# Patient Record
Sex: Male | Born: 2000 | Race: White | Hispanic: No | Marital: Single | Smoking: Never smoker
Health system: Southern US, Community
[De-identification: ages and names within clinical notes are randomized; demographics above are authoritative.]

## PROBLEM LIST (undated history)

## (undated) DIAGNOSIS — M419 Scoliosis, unspecified: Secondary | ICD-10-CM

## (undated) DIAGNOSIS — R509 Fever, unspecified: Secondary | ICD-10-CM

## (undated) DIAGNOSIS — Q761 Klippel-Feil syndrome: Secondary | ICD-10-CM

## (undated) DIAGNOSIS — S5290XA Unspecified fracture of unspecified forearm, initial encounter for closed fracture: Secondary | ICD-10-CM

## (undated) DIAGNOSIS — Z00129 Encounter for routine child health examination without abnormal findings: Secondary | ICD-10-CM

## (undated) DIAGNOSIS — A0811 Acute gastroenteropathy due to Norwalk agent: Secondary | ICD-10-CM

## (undated) DIAGNOSIS — L709 Acne, unspecified: Secondary | ICD-10-CM

## (undated) DIAGNOSIS — J302 Other seasonal allergic rhinitis: Secondary | ICD-10-CM

## (undated) HISTORY — DX: Other seasonal allergic rhinitis: J30.2

## (undated) HISTORY — DX: Acute gastroenteropathy due to Norwalk agent: A08.11

## (undated) HISTORY — DX: Fever, unspecified: R50.9

## (undated) HISTORY — DX: Scoliosis, unspecified: M41.9

## (undated) HISTORY — DX: Klippel-Feil syndrome: Q76.1

## (undated) HISTORY — DX: Acne, unspecified: L70.9

## (undated) HISTORY — DX: Encounter for routine child health examination without abnormal findings: Z00.129

## (undated) HISTORY — DX: Unspecified fracture of unspecified forearm, initial encounter for closed fracture: S52.90XA

---
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
## 2022-10-13 ENCOUNTER — Telehealth: Payer: Self-pay | Admitting: Family Medicine

## 2022-10-13 NOTE — Telephone Encounter (Signed)
Pt called stating he was looking to restart his Vyvanse from a previous provider. After consulting with Peterson Ao, advised pt that he would need to have the OV notes from the provider's office sent over to our ffice and he would need an appt to talk with the provider. Advised that Dr. Abner Greenspan is booked out on paper until February and a note would be sent back to look into this for him. Pt acknowledged understanding.

## 2022-10-15 NOTE — Telephone Encounter (Signed)
Pt's mom was returning call. Did not see her on an updated dpr, please advise.

## 2022-10-15 NOTE — Telephone Encounter (Signed)
Left message on machine to call back  

## 2022-10-19 NOTE — Telephone Encounter (Signed)
Called pt Lvm to call our office back to make appt  Per Dr . Abner Greenspan before we can restart medication

## 2022-10-19 NOTE — Progress Notes (Unsigned)
   Established Patient Office Visit  Subjective   Patient ID: Jared Edwards, male    DOB: 2001/06/05  Age: 21 y.o. MRN: [id number]  No chief complaint on file.   HPI    ***   {History (Optional):23778}  ROS    Objective:     There were no vitals taken for this visit. {Vitals History (Optional):23777}  Physical Exam   No results found for any visits on 10/20/22.  {Labs (Optional):23779}  The ASCVD Risk score (Arnett DK, et al., 2019) failed to calculate for the following reasons:   The 2019 ASCVD risk score is only valid for ages 46 to 51    Assessment & Plan:   Problem List Items Addressed This Visit   None   No follow-ups on file.    Clayborne Dana, NP

## 2022-10-20 ENCOUNTER — Telehealth: Payer: Self-pay | Admitting: Family Medicine

## 2022-10-20 ENCOUNTER — Encounter: Payer: Self-pay | Admitting: Family Medicine

## 2022-10-20 ENCOUNTER — Ambulatory Visit: Payer: 59 | Admitting: Family Medicine

## 2022-10-20 VITALS — BP 115/68 | HR 96 | Wt 157.6 lb

## 2022-10-20 DIAGNOSIS — F909 Attention-deficit hyperactivity disorder, unspecified type: Secondary | ICD-10-CM

## 2022-10-20 DIAGNOSIS — F419 Anxiety disorder, unspecified: Secondary | ICD-10-CM

## 2022-10-20 DIAGNOSIS — Z79899 Other long term (current) drug therapy: Secondary | ICD-10-CM

## 2022-10-20 DIAGNOSIS — F32A Depression, unspecified: Secondary | ICD-10-CM | POA: Diagnosis not present

## 2022-10-20 MED ORDER — VYVANSE 40 MG PO CAPS: 40.0000 mg | ORAL_CAPSULE | Freq: Every day | ORAL | 0 refills | Status: DC

## 2022-10-20 NOTE — Assessment & Plan Note (Signed)
Verified dosage and reviewed PDMP.  Requesting records from [facility] today Discussed safety of medication and information sheet added to AVS One month supply given for now until records are returned to PCP

## 2022-10-20 NOTE — Assessment & Plan Note (Signed)
No SI/HI He declined starting and medications today.  Referral placed to behavioral healthy for evaluation, med management, counseling

## 2022-10-20 NOTE — Patient Instructions (Addendum)
One month refill of Vyvanse while we wait for records to come in. Behavioral Health referral for mood concerns. You declined any anxiety/depression meds at this time and confirmed that the Vyvanse did not affect your mood at all.  Getting your baseline urine drug screen today.

## 2022-10-20 NOTE — Telephone Encounter (Signed)
Misty Stanley (mother) called to see about having the generic version of vyvanse sent in for pt rather than brand name. Please advise.

## 2022-10-23 LAB — DRUG MONITORING, PANEL 8 WITH CONFIRMATION, URINE
6 Acetylmorphine: NEGATIVE ng/mL (ref <10)
Alcohol Metabolites: NEGATIVE ng/mL (ref <500)
Amphetamine: 6133 ng/mL — ABNORMAL HIGH (ref <250)
Amphetamines: POSITIVE ng/mL — AB (ref <500)
Benzodiazepines: NEGATIVE ng/mL (ref <100)
Buprenorphine, Urine: NEGATIVE ng/mL (ref <5)
Cocaine Metabolite: NEGATIVE ng/mL (ref <150)
Creatinine: 214.4 mg/dL (ref >=20.0)
MDMA: NEGATIVE ng/mL (ref <500)
Marijuana Metabolite: NEGATIVE ng/mL (ref <20)
Methamphetamine: NEGATIVE ng/mL (ref <250)
Opiates: NEGATIVE ng/mL (ref <100)
Oxidant: NEGATIVE ug/mL (ref <200)
Oxycodone: NEGATIVE ng/mL (ref <100)
pH: 6 (ref 4.5–9.0)

## 2022-10-23 LAB — DM TEMPLATE

## 2022-10-25 MED ORDER — LISDEXAMFETAMINE DIMESYLATE 40 MG PO CAPS: 40.0000 mg | ORAL_CAPSULE | ORAL | 0 refills | Status: DC

## 2022-10-25 NOTE — Telephone Encounter (Signed)
Spoke with pharmacy and they stated they were out of stock of with brand and genetic but they could order brand name but it may be expensive. I called him to see if he was ok with this but he was not.

## 2022-10-25 NOTE — Telephone Encounter (Signed)
Patient states they are out of the generic and would like the brand name Vyvanse to be sent to:  [facility] in [place] since they do have it. Please advise.

## 2022-10-26 ENCOUNTER — Other Ambulatory Visit: Payer: Self-pay | Admitting: Family Medicine

## 2022-10-26 ENCOUNTER — Telehealth: Payer: Self-pay | Admitting: Family Medicine

## 2022-10-26 MED ORDER — LISDEXAMFETAMINE DIMESYLATE 40 MG PO CHEW: 40.0000 mg | CHEWABLE_TABLET | Freq: Every day | ORAL | 0 refills | Status: DC

## 2022-10-26 NOTE — Telephone Encounter (Signed)
[facility] pharmacy calling to advise that the vyvanse and generic brand capsules are not available. Pharmacy is requesting new script to be sent in for the chewables as those are available.

## 2022-10-26 NOTE — Telephone Encounter (Signed)
Pharmacy call sent note over to provider for advised

## 2022-11-01 ENCOUNTER — Telehealth: Payer: Self-pay | Admitting: Family Medicine

## 2022-11-01 ENCOUNTER — Other Ambulatory Visit: Payer: Self-pay | Admitting: Family Medicine

## 2022-11-01 MED ORDER — LISDEXAMFETAMINE DIMESYLATE 40 MG PO CHEW: 40.0000 mg | CHEWABLE_TABLET | Freq: Every day | ORAL | 0 refills | Status: DC

## 2022-11-01 NOTE — Telephone Encounter (Signed)
Pt states VYVANSE capsules are out of stock and he needs the chewables sent to the [facility].    Lisdexamfetamine Dimesylate (VYVANSE) 40 MG CHEW  [id number] - [address] [place], [place] - [address] [facility] [address] [place]o B[place] [address] Phone: [phone]  Fax: [phone]

## 2022-11-01 NOTE — Telephone Encounter (Signed)
Prescrption canceled

## 2022-12-06 ENCOUNTER — Other Ambulatory Visit: Payer: Self-pay | Admitting: Family Medicine

## 2022-12-06 ENCOUNTER — Other Ambulatory Visit: Payer: Self-pay

## 2022-12-06 ENCOUNTER — Telehealth: Payer: Self-pay | Admitting: Family Medicine

## 2022-12-06 MED ORDER — LISDEXAMFETAMINE DIMESYLATE 40 MG PO CHEW: 40.0000 mg | CHEWABLE_TABLET | Freq: Every day | ORAL | 0 refills | Status: DC

## 2022-12-06 NOTE — Telephone Encounter (Signed)
Patient called to request the capsule be called in instead of the chewable.   Prescription Request  12/06/2022  Is this a "Controlled Substance" medicine? YES  LOV: Visit date not found  What is the name of the medication or equipment?   Have you contacted your pharmacy to request a refill? No   Which pharmacy would you like this sent to?  [facility] # [place], [place] - [place] [address] [place] [address] Phone: [phone]  Fax: [phone]   Patient notified that their request is being sent to the clinical staff for review and that they should receive a response within 2 business days.   Please advise at Mobile [phone] (mobile)

## 2022-12-06 NOTE — Telephone Encounter (Signed)
Patient said he called the pharmacy and they are still out of the capsules so he is okay to receive the chewables again.

## 2023-01-07 ENCOUNTER — Telehealth: Payer: Self-pay | Admitting: Family Medicine

## 2023-01-07 NOTE — Telephone Encounter (Signed)
Prescription Request  01/07/2023  Is this a "Controlled Substance" medicine? Yes  LOV: Visit date not found  What is the name of the medication or equipment? Lisdexamfetamine Dimesylate (VYVANSE) 40 MG CHEW   Have you contacted your pharmacy to request a refill? No   Which pharmacy would you like this sent to?   [facility] # [place], [place] - [place] [address] Phone: [phone] Fax: [phone]    Patient notified that their request is being sent to the clinical staff for review and that they should receive a response within 2 business days.   Please advise at Mobile [phone] (mobile)

## 2023-01-10 ENCOUNTER — Other Ambulatory Visit: Payer: Self-pay

## 2023-01-10 NOTE — Telephone Encounter (Signed)
Refill was sent

## 2023-01-11 ENCOUNTER — Other Ambulatory Visit: Payer: Self-pay | Admitting: Family Medicine

## 2023-01-11 ENCOUNTER — Other Ambulatory Visit: Payer: Self-pay

## 2023-01-11 DIAGNOSIS — F909 Attention-deficit hyperactivity disorder, unspecified type: Secondary | ICD-10-CM

## 2023-01-11 MED ORDER — LISDEXAMFETAMINE DIMESYLATE 40 MG PO CHEW: 40.0000 mg | CHEWABLE_TABLET | Freq: Every day | ORAL | 0 refills | Status: DC

## 2023-01-11 NOTE — Telephone Encounter (Signed)
Patient called to advise the capsules were called in for his vyvanse but he gets the chewables. Please send  Lisdexamfetamine Dimesylate (VYVANSE) 40 MG CHEW  to [facility] in [place]

## 2023-02-14 ENCOUNTER — Telehealth: Payer: Self-pay | Admitting: Family Medicine

## 2023-02-14 ENCOUNTER — Other Ambulatory Visit: Payer: Self-pay | Admitting: Family Medicine

## 2023-02-14 MED ORDER — LISDEXAMFETAMINE DIMESYLATE 40 MG PO CHEW: 40.0000 mg | CHEWABLE_TABLET | Freq: Every day | ORAL | 0 refills | Status: DC

## 2023-02-14 NOTE — Telephone Encounter (Signed)
Prescription Request  02/14/2023  Is this a "Controlled Substance" medicine? Yes  LOV: Visit date not found  What is the name of the medication or equipment?   Lisdexamfetamine Dimesylate (VYVANSE) 40 MG CHEW [id number]   Have you contacted your pharmacy to request a refill? No   Which pharmacy would you like this sent to?   [facility] # [place], [place] - [place] [address] Phone: [phone] Fax: [phone]    Patient notified that their request is being sent to the clinical staff for review and that they should receive a response within 2 business days.   Please advise at Mobile [phone] (mobile)

## 2023-03-22 ENCOUNTER — Other Ambulatory Visit: Payer: Self-pay

## 2023-03-22 ENCOUNTER — Telehealth: Payer: Self-pay | Admitting: Family Medicine

## 2023-03-22 ENCOUNTER — Other Ambulatory Visit: Payer: Self-pay | Admitting: Family Medicine

## 2023-03-22 MED ORDER — LISDEXAMFETAMINE DIMESYLATE 40 MG PO CHEW: 40.0000 mg | CHEWABLE_TABLET | Freq: Every day | ORAL | 0 refills | Status: DC

## 2023-03-22 NOTE — Telephone Encounter (Signed)
Prescription Request  03/22/2023  Is this a "Controlled Substance" medicine? Yes  LOV: Visit date not found  What is the name of the medication or equipment?  Lisdexamfetamine Dimesylate (VYVANSE) 40 MG CHEW   Have you contacted your pharmacy to request a refill? No   Which pharmacy would you like this sent to?   [facility] [address] [place], [place] - [address] [facility] [address] [place] [address] Phone: [phone] Fax: [phone]    Patient notified that their request is being sent to the clinical staff for review and that they should receive a response within 2 business days.   Please advise at Mobile [phone] (mobile)

## 2023-04-20 ENCOUNTER — Telehealth: Payer: Self-pay | Admitting: Family Medicine

## 2023-04-20 ENCOUNTER — Other Ambulatory Visit: Payer: Self-pay | Admitting: Family Medicine

## 2023-04-20 MED ORDER — LISDEXAMFETAMINE DIMESYLATE 40 MG PO CHEW: 40.0000 mg | CHEWABLE_TABLET | Freq: Every day | ORAL | 0 refills | Status: DC

## 2023-04-20 NOTE — Telephone Encounter (Signed)
Appt made for 04/21/23 with Hyman Hopes.

## 2023-04-20 NOTE — Telephone Encounter (Signed)
Prescription Request  04/20/2023  Is this a "Controlled Substance" medicine? Yes  LOV: Visit date not found  What is the name of the medication or equipment?  Lisdexamfetamine Dimesylate (VYVANSE) 40 MG CHEW   Have you contacted your pharmacy to request a refill? Yes   Which pharmacy would you like this sent to?  [facility] # [address] [place], [place] - [address] [facility] [address] [place] [address] Phone: [phone] Fax: [phone]    Patient notified that their request is being sent to the clinical staff for review and that they should receive a response within 2 business days.   Please advise at [facility] [phone]

## 2023-04-21 ENCOUNTER — Ambulatory Visit: Payer: 59 | Admitting: Family Medicine

## 2023-05-04 ENCOUNTER — Ambulatory Visit: Payer: 59 | Admitting: Family Medicine

## 2023-05-04 ENCOUNTER — Encounter: Payer: Self-pay | Admitting: Family Medicine

## 2023-05-04 VITALS — BP 124/69 | HR 84 | Ht 64.0 in | Wt 163.0 lb

## 2023-05-04 DIAGNOSIS — F909 Attention-deficit hyperactivity disorder, unspecified type: Secondary | ICD-10-CM

## 2023-05-04 NOTE — Assessment & Plan Note (Signed)
Attention Deficit Hyperactivity Disorder (ADHD): Stable on Vyvanse 40 mg daily (verified in PDMP). No reported side effects. Transitioning management from [facility] to Dr. Abner Greenspan. -Request records from [facility]. -Update controlled substance contract today -Plan for follow-up every three months for medication refills. -Subject to annual urine drug screen. -Continue Vyvanse 40 mg daily.

## 2023-05-04 NOTE — Progress Notes (Signed)
   Acute Office Visit  Subjective:     Patient ID: Jared Edwards, male    DOB: Feb 21, 2001, 22 y.o.   MRN: [id number]  Chief Complaint  Patient presents with   Medical Management of Chronic Issues   ADHD    HPI Patient is in today for ADHD medication.  Discussed the use of AI scribe software for clinical note transcription with the patient, who gave verbal consent to proceed.  History of Present Illness   The patient, with a history of ADHD, presents for a transfer of their Vyvanse prescription from [facility] to Dr. Abner Greenspan. They had a gap of approximately six months off the medication, but have been back on it since early this year (January or February). They are currently on a 40 mg dosage, which they report is effective and without side effects. They recently received a refill and have enough medication for the next month.           ROS All review of systems negative except what is listed in the HPI      Objective:    BP 124/69   Pulse 84   Ht 5\' 4"  (1.626 m)   Wt 163 lb (73.9 kg)   SpO2 100%   BMI 27.98 kg/m    Physical Exam Vitals reviewed.  Constitutional:      Appearance: Normal appearance.  Cardiovascular:     Rate and Rhythm: Normal rate and regular rhythm.     Pulses: Normal pulses.     Heart sounds: Normal heart sounds.  Pulmonary:     Effort: Pulmonary effort is normal.     Breath sounds: Normal breath sounds.  Skin:    General: Skin is warm and dry.  Neurological:     Mental Status: He is alert and oriented to person, place, and time.  Psychiatric:        Mood and Affect: Mood normal.        Behavior: Behavior normal.        Thought Content: Thought content normal.        Judgment: Judgment normal.     No results found for any visits on 05/04/23.      Assessment & Plan:   Problem List Items Addressed This Visit     Attention deficit hyperactivity disorder (ADHD) - Primary (Chronic)    Attention Deficit  Hyperactivity Disorder (ADHD): Stable on Vyvanse 40 mg daily (verified in PDMP). No reported side effects. Transitioning management from [facility] to Dr. Abner Greenspan. -Request records from [facility]. -Update controlled substance contract today -Plan for follow-up every three months for medication refills. -Subject to annual urine drug screen. -Continue Vyvanse 40 mg daily.              No orders of the defined types were placed in this encounter.   Return in about 3 months (around 08/04/2023) for Vyvanse f/u .  Clayborne Dana, NP

## 2023-05-04 NOTE — Patient Instructions (Addendum)
Contract updated today so we can start prescribing your Vyvanse at next refill. Follow-up every 3 months for refills We will request records from [facility]

## 2023-05-23 ENCOUNTER — Other Ambulatory Visit: Payer: Self-pay | Admitting: Family Medicine

## 2023-05-23 ENCOUNTER — Telehealth: Payer: Self-pay | Admitting: Family Medicine

## 2023-05-23 MED ORDER — LISDEXAMFETAMINE DIMESYLATE 40 MG PO CHEW: 40.0000 mg | CHEWABLE_TABLET | Freq: Every day | ORAL | 0 refills | Status: DC

## 2023-05-23 NOTE — Telephone Encounter (Signed)
Prescription Request  05/23/2023  Is this a "Controlled Substance" medicine? Yes  LOV: Visit date not found  What is the name of the medication or equipment?   Lisdexamfetamine Dimesylate (VYVANSE) 40 MG CHEW [[id number]]   Have you contacted your pharmacy to request a refill? No   Which pharmacy would you like this sent to?   [facility] # [address] [place], [place] - [address] [facility] [address] [place] [address] Phone: [phone] Fax: [phone]    Patient notified that their request is being sent to the clinical staff for review and that they should receive a response within 2 business days.   Please advise at Mobile [phone] (mobile)

## 2023-06-24 ENCOUNTER — Telehealth: Payer: Self-pay | Admitting: Family Medicine

## 2023-06-24 NOTE — Telephone Encounter (Signed)
Patient called and need a med refill on Lisdexamfetamine Dimesylate (VYVANSE) 40 MG CHEW   he also would like capsule instead of chewable. [facility]

## 2023-06-27 ENCOUNTER — Other Ambulatory Visit: Payer: Self-pay | Admitting: Family Medicine

## 2023-06-27 MED ORDER — LISDEXAMFETAMINE DIMESYLATE 40 MG PO CHEW: 40.0000 mg | CHEWABLE_TABLET | Freq: Every day | ORAL | 0 refills | Status: DC

## 2023-06-27 NOTE — Assessment & Plan Note (Signed)
On vyvanse daily
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
## 2023-06-28 ENCOUNTER — Encounter: Payer: Self-pay | Admitting: Family Medicine

## 2023-06-28 ENCOUNTER — Ambulatory Visit: Payer: 59 | Admitting: Family Medicine

## 2023-06-28 VITALS — BP 122/70 | HR 87 | Temp 97.5°F | Resp 16 | Ht 64.0 in | Wt 168.6 lb

## 2023-06-28 DIAGNOSIS — Z79899 Other long term (current) drug therapy: Secondary | ICD-10-CM | POA: Diagnosis not present

## 2023-06-28 DIAGNOSIS — F909 Attention-deficit hyperactivity disorder, unspecified type: Secondary | ICD-10-CM

## 2023-06-28 MED ORDER — LISDEXAMFETAMINE DIMESYLATE 50 MG PO CAPS: 50.0000 mg | ORAL_CAPSULE | Freq: Every day | ORAL | 0 refills | Status: DC

## 2023-06-28 NOTE — Patient Instructions (Signed)
No Shaune Spittle

## 2023-08-19 ENCOUNTER — Telehealth: Payer: Self-pay | Admitting: Family Medicine

## 2023-08-19 ENCOUNTER — Other Ambulatory Visit: Payer: Self-pay | Admitting: Family

## 2023-08-19 MED ORDER — LISDEXAMFETAMINE DIMESYLATE 50 MG PO CAPS: 50.0000 mg | ORAL_CAPSULE | Freq: Every day | ORAL | 0 refills | Status: DC

## 2023-08-19 NOTE — Telephone Encounter (Signed)
Prescription Request  08/19/2023  Is this a "Controlled Substance" medicine? Yes  LOV: 06/28/2023  What is the name of the medication or equipment? lisdexamfetamine (VYVANSE) 50 MG capsule  Have you contacted your pharmacy to request a refill? No   Which pharmacy would you like this sent to?    [facility] # [address] [place], [place] - [address] [facility] [address] [place] [address] Phone: [phone] Fax: [phone]    Patient notified that their request is being sent to the clinical staff for review and that they should receive a response within 2 business days.   Please advise at [facility] [phone]

## 2023-08-22 ENCOUNTER — Other Ambulatory Visit: Payer: Self-pay | Admitting: Family Medicine

## 2023-08-22 MED ORDER — LISDEXAMFETAMINE DIMESYLATE 50 MG PO CAPS: 50.0000 mg | ORAL_CAPSULE | Freq: Every day | ORAL | 0 refills | Status: DC

## 2023-08-22 NOTE — Telephone Encounter (Signed)
Rx was sent to the wrong pharmacy. Please send to:   [facility] # [address] [place], [place] - [address] [facility] [address] [place] [address] Phone: [phone] Fax: [phone]

## 2023-09-20 ENCOUNTER — Telehealth: Payer: Self-pay | Admitting: Family Medicine

## 2023-09-20 ENCOUNTER — Other Ambulatory Visit: Payer: Self-pay | Admitting: Family Medicine

## 2023-09-20 MED ORDER — LISDEXAMFETAMINE DIMESYLATE 50 MG PO CAPS: 50.0000 mg | ORAL_CAPSULE | Freq: Every day | ORAL | 0 refills | Status: DC

## 2023-09-20 NOTE — Telephone Encounter (Signed)
Prescription Request  09/20/2023  Is this a "Controlled Substance" medicine? Yes  LOV: 06/28/2023  What is the name of the medication or equipment?   lisdexamfetamine (VYVANSE) 50 MG capsule [[id number]]  Have you contacted your pharmacy to request a refill? No   Which pharmacy would you like this sent to?   [facility] # [address] [place], [place] - [address] [facility] [address] [place] [address] Phone: [phone] Fax: [phone]    Patient notified that their request is being sent to the clinical staff for review and that they should receive a response within 2 business days.   Please advise at Mobile [phone] (mobile)

## 2023-09-28 ENCOUNTER — Ambulatory Visit: Payer: 59 | Admitting: Family Medicine

## 2023-09-28 ENCOUNTER — Encounter: Payer: Self-pay | Admitting: Family Medicine

## 2023-09-28 VITALS — BP 133/71 | HR 106 | Ht 64.0 in | Wt 168.0 lb

## 2023-09-28 DIAGNOSIS — Z79899 Other long term (current) drug therapy: Secondary | ICD-10-CM

## 2023-09-28 DIAGNOSIS — J069 Acute upper respiratory infection, unspecified: Secondary | ICD-10-CM

## 2023-09-28 DIAGNOSIS — F909 Attention-deficit hyperactivity disorder, unspecified type: Secondary | ICD-10-CM

## 2023-09-28 LAB — POC COVID19 BINAXNOW: SARS Coronavirus 2 Ag: NEGATIVE

## 2023-09-28 NOTE — Progress Notes (Signed)
Acute Office Visit  Subjective:     Patient ID: Jared Edwards, male    DOB: 11-10-2001, 22 y.o.   MRN: [id number]  Chief Complaint  Patient presents with   Medical Management of Chronic Issues    HPI Patient is in today for follow-up.    Discussed the use of AI scribe software for clinical note transcription with the patient, who gave verbal consent to proceed.  History of Present Illness   The patient, currently on Vyvanse, presents for a routine three-month follow-up and annual contract renewal. They report no side effects from the medication and overall, they are doing well.  However, they have been experiencing symptoms of a viral infection for the past one to two days. The symptoms include headache, body aches, and nasal congestion, particularly severe in the mornings. They also report a sensation of pressure in the ears. They have not experienced any nausea, vomiting, or diarrhea, but their partner, who had similar symptoms, did. The partner was recently treated in the ER for dehydration, suspected to be due to a stomach bug.  The patient has not checked for fever but did not feel feverish. They have not had any cough, except for a minor one due to a dry throat. They have not been tested for COVID-19 but agree to undergo testing due to their current symptoms.              ROS All review of systems negative except what is listed in the HPI      Objective:    BP 133/71   Pulse (!) 106   Ht 5\' 4"  (1.626 m)   Wt 168 lb (76.2 kg)   SpO2 98%   BMI 28.84 kg/m    Physical Exam Vitals reviewed.  Constitutional:      Appearance: Normal appearance.  HENT:     Head: Normocephalic and atraumatic.     Right Ear: Tympanic membrane normal.     Left Ear: Tympanic membrane normal.     Nose: Congestion and rhinorrhea present.     Mouth/Throat:     Pharynx: No oropharyngeal exudate or posterior oropharyngeal erythema.  Eyes:     Conjunctiva/sclera: Conjunctivae  normal.  Cardiovascular:     Rate and Rhythm: Normal rate and regular rhythm.     Heart sounds: Normal heart sounds.  Pulmonary:     Effort: Pulmonary effort is normal.     Breath sounds: Normal breath sounds.  Musculoskeletal:     Cervical back: Normal range of motion and neck supple. No tenderness.  Lymphadenopathy:     Cervical: No cervical adenopathy.  Skin:    General: Skin is warm and dry.  Neurological:     Mental Status: He is alert and oriented to person, place, and time.  Psychiatric:        Mood and Affect: Mood normal.        Behavior: Behavior normal.        Thought Content: Thought content normal.        Judgment: Judgment normal.        Results for orders placed or performed in visit on 09/28/23  POC COVID-19 BinaxNow  Result Value Ref Range   SARS Coronavirus 2 Ag Negative Negative        Assessment & Plan:   Problem List Items Addressed This Visit       Active Problems   Attention deficit hyperactivity disorder (ADHD) (Chronic)   Other Visit Diagnoses  High risk medication use    -  Primary   Relevant Orders   DRUG MONITORING PANEL [id number] , URINE   Upper respiratory tract infection, unspecified type       Relevant Orders   POC COVID-19 BinaxNow (Completed)         ADHD Stable on Vyvanse. Annual controlled substance agreement and urine drug screen due. -Complete controlled substance agreement. -Perform urine drug screen.  Acute Illness Symptoms of headache, body aches, and congestion for 1-2 days. Possible exposure to a stomach bug. No fever or respiratory symptoms. Physical exam shows possible fluid in ears and swollen nasal passages. -Perform COVID-19 test. Negative results. Recommend supportive measures and follow-up if not improving.        No orders of the defined types were placed in this encounter.   Return in about 3 months (around 12/29/2023) for routine follow-up.  Clayborne Dana, NP

## 2023-10-01 LAB — DRUG MONITORING PANEL 375977 , URINE
Amphetamine: 1838 ng/mL — ABNORMAL HIGH (ref <250)
Amphetamines: POSITIVE ng/mL — AB (ref <500)
Barbiturates: NEGATIVE ng/mL (ref <300)
Benzodiazepines: NEGATIVE ng/mL (ref <100)
Cocaine Metabolite: NEGATIVE ng/mL (ref <150)
Desmethyltramadol: NEGATIVE ng/mL (ref <100)
Marijuana Metabolite: NEGATIVE ng/mL (ref <20)
Methamphetamine: NEGATIVE ng/mL (ref <250)
Opiates: NEGATIVE ng/mL (ref <100)
Oxycodone: NEGATIVE ng/mL (ref <100)
Tramadol Comments: NEGATIVE ng/mL (ref <500)
Tramadol: NEGATIVE ng/mL (ref <100)

## 2023-10-01 LAB — DM TEMPLATE

## 2023-10-21 ENCOUNTER — Telehealth: Payer: Self-pay | Admitting: Family Medicine

## 2023-10-21 ENCOUNTER — Other Ambulatory Visit: Payer: Self-pay | Admitting: Family

## 2023-10-21 MED ORDER — LISDEXAMFETAMINE DIMESYLATE 50 MG PO CAPS: 50.0000 mg | ORAL_CAPSULE | Freq: Every day | ORAL | 0 refills | Status: DC

## 2023-10-21 NOTE — Telephone Encounter (Signed)
Requesting: VYvance 50 mg Capsules Contract: 09/28/2023 UDS:09/28/2023 Last Visit:06/28/2023 Next Visit: none Last Refill:09/20/2023  Please Advise

## 2023-10-21 NOTE — Telephone Encounter (Signed)
**  Pt called stating to make sure it goes to the [facility].**  Prescription Request  10/21/2023  Is this a "Controlled Substance" medicine? Yes  LOV: 06/28/2023  What is the name of the medication or equipment?   lisdexamfetamine (VYVANSE) 50 MG capsule [[id number]]  Have you contacted your pharmacy to request a refill? No   Which pharmacy would you like this sent to?   [facility] # [address] [place], [place] - [address] [facility] [address] [place] [address] Phone: [phone] Fax: [phone]    Patient notified that their request is being sent to the clinical staff for review and that they should receive a response within 2 business days.   Please advise at Mobile [phone] (mobile)

## 2023-10-24 ENCOUNTER — Encounter: Payer: Self-pay | Admitting: Family Medicine

## 2023-10-24 ENCOUNTER — Other Ambulatory Visit: Payer: Self-pay | Admitting: Family Medicine

## 2023-10-24 NOTE — Telephone Encounter (Signed)
Spoke with patient earlier and that will work

## 2023-10-24 NOTE — Telephone Encounter (Signed)
Requesting:VYvance 50 mg capsules Contract:09/28/2023 UDS:09/28/2023 Last Visit:09/28/2023 Next Visit:01/24/2024 Last Refill:10/21/2023 (Only got a 30 a 30 day supply)  Please Advise

## 2023-10-24 NOTE — Telephone Encounter (Signed)
error 

## 2023-10-26 NOTE — Telephone Encounter (Signed)
Pt called to follow up on his refill of vyvanse. [facility] Pt said this is incorrect. Please cancel prescription at [facility] and send to [facility] in [facility]. [facility] and [facility] only [facility]. Please call pt to advise when medication has been rereouted

## 2023-10-26 NOTE — Telephone Encounter (Signed)
Called patient. Stated he would wait to have pharmacies switched on Monday. [facility] been [facility] from his chart

## 2023-10-31 ENCOUNTER — Other Ambulatory Visit: Payer: Self-pay | Admitting: Family

## 2023-10-31 MED ORDER — LISDEXAMFETAMINE DIMESYLATE 50 MG PO CAPS: 50.0000 mg | ORAL_CAPSULE | Freq: Every day | ORAL | 0 refills | Status: DC

## 2023-10-31 NOTE — Telephone Encounter (Signed)
Pt calling back to see if we can send in his refill for vyvanse to [facility] in [facility].

## 2023-11-01 NOTE — Telephone Encounter (Signed)
Pt notified that rx was sent in 

## 2023-11-29 ENCOUNTER — Other Ambulatory Visit: Payer: Self-pay | Admitting: Family Medicine

## 2023-11-29 NOTE — Telephone Encounter (Signed)
 Copied from CRM [id number]. Topic: Clinical - Medication Refill >> Nov 29, 2023 11:19 AM Joanell NOVAK wrote: Most Recent Primary Care Visit:  Provider: ALMARIE WADDELL NOVAK  Department: [facility]  Visit Type: OFFICE VISIT  Date: 09/28/2023  Medication: isdexamfetamine (VYVANSE ) 50 MG capsule  Has the patient contacted their pharmacy? No (Agent: If no, request that the patient contact the pharmacy for the refill. If patient does not wish to contact the pharmacy document the reason why and proceed with request.) (Agent: If yes, when and what did the pharmacy advise?)  Is this the correct pharmacy for this prescription? Yes If no, delete pharmacy and type the correct one.  This is the patient's preferred pharmacy:  [facility] # [address] [place], [place] - [address] [facility] [address] [place] [address] Phone: [phone] Fax: [phone]   Has the prescription been filled recently? Yes  Is the patient out of the medication? No  Has the patient been seen for an appointment in the last year OR does the patient have an upcoming appointment?   Can we respond through MyChart?   Agent: Please be advised that Rx refills may take up to 3 business days. We ask that you follow-up with your pharmacy.

## 2023-11-29 NOTE — Telephone Encounter (Signed)
 Requesting: Vyvanse 50mg   Contract: 09/28/23 UDS: 09/28/23 Last Visit: 09/28/23 w/ Ladona Ridgel Next Visit: 01/24/24 w/ Ladona Ridgel Last Refill: 10/31/23 #30 and 0RF   Please Advise

## 2023-11-30 MED ORDER — LISDEXAMFETAMINE DIMESYLATE 50 MG PO CAPS: 50.0000 mg | ORAL_CAPSULE | Freq: Every day | ORAL | 0 refills | Status: DC

## 2023-12-28 NOTE — Assessment & Plan Note (Deleted)
Doing well. Offered anticipatory guidance regarding avoiding cigarettes, alcohol etc. Advised always to wear seat belt and to get adequate sleep at night. Continue heart healthy diet and regular exercise.

## 2023-12-28 NOTE — Assessment & Plan Note (Addendum)
Currently on Vyvanse 50 mg daily, doing well has tolerated the dose adjustment.

## 2023-12-29 ENCOUNTER — Encounter: Payer: Self-pay | Admitting: Family Medicine

## 2023-12-29 ENCOUNTER — Ambulatory Visit: Payer: 59 | Admitting: Family Medicine

## 2023-12-29 VITALS — BP 116/74 | HR 90 | Temp 97.9°F | Resp 18 | Ht 65.0 in | Wt 177.2 lb

## 2023-12-29 DIAGNOSIS — Z0000 Encounter for general adult medical examination without abnormal findings: Secondary | ICD-10-CM

## 2023-12-29 DIAGNOSIS — F909 Attention-deficit hyperactivity disorder, unspecified type: Secondary | ICD-10-CM

## 2023-12-29 MED ORDER — LISDEXAMFETAMINE DIMESYLATE 50 MG PO CAPS: 50.0000 mg | ORAL_CAPSULE | Freq: Every day | ORAL | 0 refills | Status: DC
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
## 2024-01-24 ENCOUNTER — Ambulatory Visit: Payer: 59 | Admitting: Family Medicine

## 2024-02-01 ENCOUNTER — Other Ambulatory Visit: Payer: Self-pay | Admitting: Family Medicine

## 2024-02-27 ENCOUNTER — Other Ambulatory Visit: Payer: Self-pay | Admitting: Family Medicine

## 2024-02-27 MED ORDER — LISDEXAMFETAMINE DIMESYLATE 50 MG PO CAPS: 50.0000 mg | ORAL_CAPSULE | Freq: Every day | ORAL | 0 refills | Status: DC

## 2024-02-27 NOTE — Telephone Encounter (Signed)
 Requesting: vyvanse Contract:11/16/23 UDS:09/28/23 Last Visit:12/29/23 Next Visit:03/08/24 Last Refill:12/29/23  Please Advise

## 2024-03-08 ENCOUNTER — Encounter: Payer: Self-pay | Admitting: Family Medicine

## 2024-03-08 ENCOUNTER — Ambulatory Visit: Payer: 59 | Admitting: Family Medicine

## 2024-03-08 VITALS — BP 107/73 | HR 90 | Ht 65.0 in | Wt 175.0 lb

## 2024-03-08 DIAGNOSIS — F909 Attention-deficit hyperactivity disorder, unspecified type: Secondary | ICD-10-CM | POA: Diagnosis not present

## 2024-03-08 NOTE — Progress Notes (Signed)
   Established Patient Office Visit  Subjective   Patient ID: Jared Edwards, male    DOB: 2001/09/04  Age: 23 y.o. MRN: [id number]  Chief Complaint  Patient presents with   Medical Management of Chronic Issues    HPI  Patient here for Vyvanse follow-up. No concerns.   ADHD/ADD: - Medications: Vyvanse 50 mg daily - Satisfied with current therapy: yes - Controlled substance contract: yes - UDS: yes October 2024 - Previous psychiatry evaluation: yes - Previous medications:  - ADHD Medication Side Effects: no    Decreased appetite: no    Headache: no    Sleeping disturbance pattern: no    Irritability: no    Anxiousness: no    Dizziness: no     ROS All review of systems negative except what is listed in the HPI    Objective:     BP 107/73   Pulse 90   Ht 5\' 5"  (1.651 m)   Wt 175 lb (79.4 kg)   SpO2 99%   BMI 29.12 kg/m    Physical Exam Vitals reviewed.  Constitutional:      Appearance: Normal appearance.  Cardiovascular:     Rate and Rhythm: Normal rate and regular rhythm.     Heart sounds: Normal heart sounds.  Pulmonary:     Effort: Pulmonary effort is normal.     Breath sounds: Normal breath sounds.  Skin:    General: Skin is warm and dry.  Neurological:     Mental Status: He is alert and oriented to person, place, and time.  Psychiatric:        Mood and Affect: Mood normal.        Behavior: Behavior normal.        Thought Content: Thought content normal.        Judgment: Judgment normal.      No results found for any visits on 03/08/24.    The ASCVD Risk score (Arnett DK, et al., 2019) failed to calculate for the following reasons:   The 2019 ASCVD risk score is only valid for ages 101 to 78    Assessment & Plan:   Problem List Items Addressed This Visit       Active Problems   Attention deficit hyperactivity disorder (ADHD) - Primary (Chronic)   Stable on current Vyvanse. UDS and contract up to date. Not yet due for refills.  Follow-up 3 months.        Return in about 3 months (around 06/07/2024) for routine follow-up/Vyvanse.    Clayborne Dana, NP

## 2024-03-08 NOTE — Assessment & Plan Note (Signed)
 Stable on current Vyvanse. UDS and contract up to date. Not yet due for refills. Follow-up 3 months.

## 2024-03-27 ENCOUNTER — Other Ambulatory Visit: Payer: Self-pay | Admitting: Family Medicine

## 2024-03-27 NOTE — Telephone Encounter (Signed)
 Refill already on file for April 2025

## 2024-03-27 NOTE — Telephone Encounter (Signed)
 Copied from CRM [id number]. Topic: Clinical - Medication Refill >> Mar 27, 2024 10:21 AM Elle L wrote: Most Recent Primary Care Visit:  Provider: Everlina Hock  Department: [facility]  Visit Type: OFFICE VISIT  Date: 03/08/2024  Medication: lisdexamfetamine (VYVANSE ) 50 MG capsule  The patient is requesting 3 refills be sent in. Unable to pend due to pop-up.  Has the patient contacted their pharmacy? Yes  Is this the correct pharmacy for this prescription? Yes  This is the patient's preferred pharmacy:  [facility] # [address] [place], [place] - [address] [facility] [address] [place] [address] Phone: [phone] Fax: [phone]   Has the prescription been filled recently? Yes  Is the patient out of the medication? No  Has the patient been seen for an appointment in the last year OR does the patient have an upcoming appointment? Yes  Can we respond through MyChart? Yes  Agent: Please be advised that Rx refills may take up to 3 business days. We ask that you follow-up with your pharmacy.

## 2024-04-30 ENCOUNTER — Other Ambulatory Visit: Payer: Self-pay | Admitting: Family Medicine

## 2024-04-30 MED ORDER — LISDEXAMFETAMINE DIMESYLATE 50 MG PO CAPS: 50.0000 mg | ORAL_CAPSULE | Freq: Every day | ORAL | 0 refills | Status: DC

## 2024-04-30 NOTE — Telephone Encounter (Signed)
 Requesting: vyvanse  Contract:11/16/23 UDS:09/28/23 Last Visit:03/08/2024 Next Visit:06/07/2024 Last Refill:02/27/2024   Please Advise

## 2024-04-30 NOTE — Telephone Encounter (Unsigned)
 Copied from CRM [id number]. Topic: Clinical - Medication Refill >> Apr 30, 2024 12:08 PM Melissa C wrote: Medication: lisdexamfetamine (VYVANSE ) 50 MG capsule  Has the patient contacted their pharmacy? Yes (Agent: If no, request that the patient contact the pharmacy for the refill. If patient does not wish to contact the pharmacy document the reason why and proceed with request.) (Agent: If yes, when and what did the pharmacy advise?)  This is the patient's preferred pharmacy:  [facility]  [address] [place] [address] Phone: [phone]   NOTE: this is a different [facility] than what was preferred before and from what is in patient's chart. Information was manually entered.   Is this the correct pharmacy for this prescription? Yes If no, delete pharmacy and type the correct one.   Has the prescription been filled recently? Yes  Is the patient out of the medication? No- 2 more days left  Has the patient been seen for an appointment in the last year OR does the patient have an upcoming appointment? Yes  Can we respond through MyChart? Yes  Agent: Please be advised that Rx refills may take up to 3 business days. We ask that you follow-up with your pharmacy.

## 2024-05-30 ENCOUNTER — Other Ambulatory Visit: Payer: Self-pay | Admitting: Family Medicine

## 2024-05-30 MED ORDER — LISDEXAMFETAMINE DIMESYLATE 50 MG PO CAPS: 50.0000 mg | ORAL_CAPSULE | Freq: Every day | ORAL | 0 refills | Status: DC

## 2024-05-30 NOTE — Telephone Encounter (Signed)
 Requesting: Vyvanse  50mg  Contract: 09/28/23 UDS: 09/28/23 Last Visit: 03/08/24 w/ Waddell Next Visit: 06/07/24 w/ Harlene GRADE Last Refill: see med list   Please Advise

## 2024-05-30 NOTE — Telephone Encounter (Unsigned)
 Copied from CRM [id number]. Topic: Clinical - Medication Refill >> May 30, 2024 10:57 AM Suzen RAMAN wrote: Medication: lisdexamfetamine (VYVANSE ) 50 MG capsule  Has the patient contacted their pharmacy? Yes   This is the patient's preferred pharmacy:  [facility] # [id number], [place] - [address] [name] [address] Phone: [phone] Fax: [phone]  Is this the correct pharmacy for this prescription? Yes If no, delete pharmacy and type the correct one.   Has the prescription been filled recently? No  Is the patient out of the medication? Yes  Has the patient been seen for an appointment in the last year OR does the patient have an upcoming appointment? Yes  Can we respond through MyChart? Yes  Agent: Please be advised that Rx refills may take up to 3 business days. We ask that you follow-up with your pharmacy.

## 2024-06-05 DIAGNOSIS — Z0000 Encounter for general adult medical examination without abnormal findings: Secondary | ICD-10-CM | POA: Insufficient documentation

## 2024-06-05 NOTE — Assessment & Plan Note (Signed)
 Encouraged patient to maintain a healthy lifestyle, including regular physical activity, adequate hydration, and a well-balanced diet. Advised limiting processed foods, added sugars, and sodium intake. Reinforced the importance of sleep, stress management, and routine preventive care.

## 2024-06-05 NOTE — Assessment & Plan Note (Signed)
 Stable on Vyvanse  50 mg daily. Not yet due for refills. Follow-up 3 months.
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
## 2024-06-07 ENCOUNTER — Ambulatory Visit (INDEPENDENT_AMBULATORY_CARE_PROVIDER_SITE_OTHER): Admitting: Student

## 2024-06-07 ENCOUNTER — Encounter: Payer: Self-pay | Admitting: Student

## 2024-06-07 VITALS — BP 120/76 | HR 81 | Temp 97.8°F | Resp 12 | Ht 65.0 in | Wt 166.6 lb

## 2024-06-07 DIAGNOSIS — Z0000 Encounter for general adult medical examination without abnormal findings: Secondary | ICD-10-CM

## 2024-06-07 DIAGNOSIS — F909 Attention-deficit hyperactivity disorder, unspecified type: Secondary | ICD-10-CM | POA: Diagnosis not present

## 2024-06-09 LAB — DRUG MONITORING PANEL 376104, URINE
Amphetamine: 3984 ng/mL — ABNORMAL HIGH (ref <250)
Amphetamines: POSITIVE ng/mL — AB (ref <500)
Barbiturates: NEGATIVE ng/mL (ref <300)
Benzodiazepines: NEGATIVE ng/mL (ref <100)
Cocaine Metabolite: NEGATIVE ng/mL (ref <150)
Desmethyltramadol: NEGATIVE ng/mL (ref <100)
Methamphetamine: NEGATIVE ng/mL (ref <250)
Opiates: NEGATIVE ng/mL (ref <100)
Oxycodone: NEGATIVE ng/mL (ref <100)
Tramadol: NEGATIVE ng/mL (ref <100)

## 2024-06-09 LAB — DM TEMPLATE

## 2024-06-10 ENCOUNTER — Ambulatory Visit: Payer: Self-pay | Admitting: Student

## 2024-06-25 ENCOUNTER — Encounter: Payer: 59 | Admitting: Family Medicine

## 2024-06-28 ENCOUNTER — Other Ambulatory Visit: Payer: Self-pay | Admitting: Family Medicine

## 2024-06-28 MED ORDER — LISDEXAMFETAMINE DIMESYLATE 50 MG PO CAPS: 50.0000 mg | ORAL_CAPSULE | Freq: Every day | ORAL | 0 refills | Status: DC

## 2024-06-28 NOTE — Telephone Encounter (Signed)
 Copied from CRM [id number]. Topic: Clinical - Medication Refill >> Jun 28, 2024  8:36 AM Revonda D wrote: Medication: lisdexamfetamine (VYVANSE ) 50 MG capsule  Has the patient contacted their pharmacy? No (Agent: If no, request that the patient contact the pharmacy for the refill. If patient does not wish to contact the pharmacy document the reason why and proceed with request.) (Agent: If yes, when and what did the pharmacy advise?)  This is the patient's preferred pharmacy:  [facility] # [address], [place] - [address] [name] [address] Phone: [phone] Fax: [phone]  Is this the correct pharmacy for this prescription? Yes If no, delete pharmacy and type the correct one.   Has the prescription been filled recently? No  Is the patient out of the medication? No  Has the patient been seen for an appointment in the last year OR does the patient have an upcoming appointment? Yes  Can we respond through MyChart? Yes  Agent: Please be advised that Rx refills may take up to 3 business days. We ask that you follow-up with your pharmacy.

## 2024-06-28 NOTE — Telephone Encounter (Signed)
 Requesting: Vyvanse  50mg  Contract: 09/28/23 UDS: 06/07/2024 Last Visit: 06/07/24 w/ Harlene GRADE Next Visit: none Last Refill: 05/30/2024   Please Advise

## 2024-07-27 ENCOUNTER — Other Ambulatory Visit: Payer: Self-pay | Admitting: Family Medicine

## 2024-07-27 MED ORDER — LISDEXAMFETAMINE DIMESYLATE 50 MG PO CAPS: 50.0000 mg | ORAL_CAPSULE | Freq: Every day | ORAL | 0 refills | Status: DC

## 2024-07-27 NOTE — Telephone Encounter (Unsigned)
 Copied from CRM [id number]. Topic: Clinical - Medication Refill >> Jul 27, 2024  7:44 AM Suzen RAMAN wrote: Medication: lisdexamfetamine (VYVANSE ) 50 MG capsule   Has the patient contacted their pharmacy? Yes   This is the patient's preferred pharmacy:  [facility] # [address] [place] - [address] [name] [address] Phone: [phone] Fax: [phone]  Is this the correct pharmacy for this prescription? Yes If no, delete pharmacy and type the correct one.   Has the prescription been filled recently? No  Is the patient out of the medication? No  Has the patient been seen for an appointment in the last year OR does the patient have an upcoming appointment? Yes  Can we respond through MyChart? Yes  Agent: Please be advised that Rx refills may take up to 3 business days. We ask that you follow-up with your pharmacy.

## 2024-07-27 NOTE — Telephone Encounter (Signed)
 Requesting: Vyvanse  50 mg Contract: 09/28/2023 UDS: 06/07/2024 Last Visit: 06/07/2024 Next Visit: 08/13/2024 Last Refill: 06/28/2024  Please Advise

## 2024-08-12 NOTE — Assessment & Plan Note (Deleted)
 Currently on Vyvanse 50 mg daily, doing well has tolerated the dose adjustment.

## 2024-08-13 ENCOUNTER — Ambulatory Visit: Admitting: Family Medicine

## 2024-08-13 DIAGNOSIS — F909 Attention-deficit hyperactivity disorder, unspecified type: Secondary | ICD-10-CM

## 2024-08-27 ENCOUNTER — Ambulatory Visit: Admitting: Family Medicine

## 2024-08-27 ENCOUNTER — Other Ambulatory Visit: Payer: Self-pay | Admitting: Family Medicine

## 2024-08-27 MED ORDER — LISDEXAMFETAMINE DIMESYLATE 50 MG PO CAPS: 50.0000 mg | ORAL_CAPSULE | Freq: Every day | ORAL | 0 refills | Status: DC

## 2024-08-27 NOTE — Telephone Encounter (Unsigned)
 Copied from CRM [id number]. Topic: Clinical - Medication Refill >> Aug 27, 2024  9:38 AM Emylou G wrote: Medication: lisdexamfetamine (VYVANSE ) 50 MG capsule  Has the patient contacted their pharmacy? No (Agent: If no, request that the patient contact the pharmacy for the refill. If patient does not wish to contact the pharmacy document the reason why and proceed with request.) (Agent: If yes, when and what did the pharmacy advise?)  This is the patient's preferred pharmacy:  [facility] # [address] [place] - [address] [name] [place] [address] Phone: [phone] Fax: [phone]  Is this the correct pharmacy for this prescription? Yes If no, delete pharmacy and type the correct one.   Has the prescription been filled recently? No  Is the patient out of the medication? Yes - week left  Has the patient been seen for an appointment in the last year OR does the patient have an upcoming appointment? Yes  Can we respond through MyChart? Yes  Agent: Please be advised that Rx refills may take up to 3 business days. We ask that you follow-up with your pharmacy.

## 2024-08-27 NOTE — Telephone Encounter (Signed)
 Requesting: Vyvanse  50mg   Contract:11/16/23 UDS: 06/07/24 Last Visit: 06/07/24 w/ Wheeler Next Visit: 12/03/24 Last Refill: 07/27/24 #30 and 0RF   Please Advise
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
## 2024-08-28 NOTE — Assessment & Plan Note (Signed)
 Stable on Vyvanse  50 mg daily. Not yet due for refills. Follow-up 3 month

## 2024-08-30 ENCOUNTER — Encounter: Payer: Self-pay | Admitting: Student

## 2024-08-30 ENCOUNTER — Ambulatory Visit: Admitting: Student

## 2024-08-30 VITALS — BP 106/69 | HR 96 | Ht 65.0 in | Wt 171.2 lb

## 2024-08-30 DIAGNOSIS — F909 Attention-deficit hyperactivity disorder, unspecified type: Secondary | ICD-10-CM

## 2024-08-30 MED ORDER — LISDEXAMFETAMINE DIMESYLATE 50 MG PO CAPS: 50.0000 mg | ORAL_CAPSULE | Freq: Every day | ORAL | 0 refills | Status: DC

## 2024-11-19 NOTE — Assessment & Plan Note (Signed)
 Stable on Vyvanse  50 mg daily. Not yet due for refills. Follow-up 3 month
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
## 2024-11-23 ENCOUNTER — Encounter: Payer: Self-pay | Admitting: Student

## 2024-11-23 ENCOUNTER — Ambulatory Visit (INDEPENDENT_AMBULATORY_CARE_PROVIDER_SITE_OTHER): Admitting: Student

## 2024-11-23 DIAGNOSIS — F909 Attention-deficit hyperactivity disorder, unspecified type: Secondary | ICD-10-CM

## 2024-11-23 DIAGNOSIS — Z91199 Patient's noncompliance with other medical treatment and regimen due to unspecified reason: Secondary | ICD-10-CM
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
## 2024-11-27 ENCOUNTER — Encounter: Payer: Self-pay | Admitting: Student

## 2024-11-27 ENCOUNTER — Ambulatory Visit: Admitting: Student

## 2024-11-27 VITALS — BP 117/75 | HR 82 | Ht 65.0 in | Wt 180.7 lb

## 2024-11-27 DIAGNOSIS — F909 Attention-deficit hyperactivity disorder, unspecified type: Secondary | ICD-10-CM

## 2024-11-27 DIAGNOSIS — R110 Nausea: Secondary | ICD-10-CM | POA: Diagnosis not present

## 2024-11-27 MED ORDER — LISDEXAMFETAMINE DIMESYLATE 40 MG PO CAPS: 40.0000 mg | ORAL_CAPSULE | ORAL | 0 refills | Status: AC

## 2024-11-27 NOTE — Assessment & Plan Note (Addendum)
 Reduced Vyvanse  dose to 40 mg, 1 month Rx sent - Monitor for improvement in nausea and sleep disturbances, RTC if persist or worsens. - Communicate via MyChart regarding effectiveness of dose adjustment. -To avoid sleep disturbances, ensure taking medication in AM, avoid large meals at bed time

## 2024-12-03 ENCOUNTER — Ambulatory Visit: Admitting: Family Medicine
